# Patient Record
Sex: Male | Born: 1990 | Race: Black or African American | Hispanic: No | Marital: Single | Smoking: Heavy tobacco smoker
Health system: Southern US, Community
[De-identification: ages and names within clinical notes are randomized; demographics above are authoritative.]

## PROBLEM LIST (undated history)

## (undated) DIAGNOSIS — F1010 Alcohol abuse, uncomplicated: Secondary | ICD-10-CM

## (undated) DIAGNOSIS — J45909 Unspecified asthma, uncomplicated: Secondary | ICD-10-CM

---
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
## 2014-07-22 ENCOUNTER — Encounter (HOSPITAL_COMMUNITY): Payer: Self-pay | Admitting: Emergency Medicine

## 2014-07-22 ENCOUNTER — Emergency Department (HOSPITAL_COMMUNITY)
Admission: EM | Admit: 2014-07-22 | Discharge: 2014-07-22 | Disposition: A | Discharge status: Home / Self Care | Payer: Self-pay | Attending: Emergency Medicine | Admitting: Emergency Medicine

## 2014-07-22 DIAGNOSIS — Y9389 Activity, other specified: Secondary | ICD-10-CM | POA: Insufficient documentation

## 2014-07-22 DIAGNOSIS — Y9289 Other specified places as the place of occurrence of the external cause: Secondary | ICD-10-CM | POA: Insufficient documentation

## 2014-07-22 DIAGNOSIS — W57XXXA Bitten or stung by nonvenomous insect and other nonvenomous arthropods, initial encounter: Secondary | ICD-10-CM | POA: Insufficient documentation

## 2014-07-22 DIAGNOSIS — S50851A Superficial foreign body of right forearm, initial encounter: Secondary | ICD-10-CM | POA: Insufficient documentation

## 2014-07-22 DIAGNOSIS — Z720 Tobacco use: Secondary | ICD-10-CM | POA: Insufficient documentation

## 2014-07-22 MED ORDER — CEPHALEXIN 500 MG PO CAPS: 500.0000 mg | ORAL_CAPSULE | Freq: Four times a day (QID) | ORAL | Status: DC

## 2014-07-22 MED ORDER — SULFAMETHOXAZOLE-TRIMETHOPRIM 800-160 MG PO TABS: 2.0000 | ORAL_TABLET | Freq: Two times a day (BID) | ORAL | Status: DC

## 2014-07-22 MED ORDER — HYDROCORTISONE 2.5 % EX LOTN: TOPICAL_LOTION | Freq: Two times a day (BID) | CUTANEOUS | Status: DC

## 2014-07-22 MED ORDER — PREDNISONE 20 MG PO TABS: 60.0000 mg | ORAL_TABLET | Freq: Every day | ORAL | Status: DC
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
## 2014-07-22 NOTE — ED Provider Notes (Signed)
CSN: [id number]     Arrival date & time 07/22/14  2237 History  This chart was scribed for a non-physician practitioner, Santiago GladHeather Aiyanah Kalama, PA-C, working with Linwood DibblesJon Knapp, MD by Julian HyMorgan Graham, ED Scribe. The patient was seen in WTR9/WTR9. The patient's care was started at 10:51 PM.   Chief Complaint  Patient presents with  . Rash   The history is provided by the patient. No language interpreter was used.   HPI Comments: Tommy Horton is a 23 y.o. male who presents to the Emergency Department complaining of new,  gradually worsening rash on his lower right arm and right hand onset 12 hours ago. Pt notes associated swelling, itching, and sensitive to palpation of the arm. Pt notes he does have some small bites on his legs bilaterally and forehead. Pt states he felt like he was being bitten by a mosquito bite last night as he slept, but pt denies seeing an insect. Pt notes he put alcohol and cortisone cream on his rash without relief. Pt denies using Benadryl for his symptoms. Pt denies fever, chills, nausea, vomiting, difficulty breathing, swelling of the tongue or lips at this time.  History reviewed. No pertinent past medical history. No past surgical history on file. No family history on file. History  Substance Use Topics  . Smoking status: Not on file  . Smokeless tobacco: Not on file  . Alcohol Use: Not on file    Review of Systems  Constitutional: Negative for fever and chills.  HENT: Negative for trouble swallowing.   Respiratory: Negative for shortness of breath.   Gastrointestinal: Negative for nausea and vomiting.  Skin: Positive for rash.   Allergies  Review of patient's allergies indicates no known allergies.  Home Medications   Prior to Admission medications   Not on File   Triage Vitals: BP 137/87  Pulse 104  Temp(Src) 98.4 F (36.9 C) (Oral)  Resp 18  SpO2 99% Physical Exam  Nursing note and vitals reviewed. Constitutional: He is oriented to person, place, and  time. He appears well-developed and well-nourished. No distress.  HENT:  Head: Normocephalic and atraumatic.  Mouth/Throat: Oropharynx is clear and moist.  Oropharynx is widely patent. No signs of lips, tongue, or throat.  Eyes: Conjunctivae and EOM are normal.  Neck: Neck supple. No tracheal deviation present.  Cardiovascular: Normal rate and regular rhythm.   Pulses:      Radial pulses are 2+ on the right side, and 2+ on the left side.  2+ right radial pulse.  Pulmonary/Chest: Effort normal and breath sounds normal. No respiratory distress.  Musculoskeletal: Normal range of motion.  FROM of right wrist and right elbow.   Neurological: He is alert and oriented to person, place, and time.  Distal sensation of all fingers is intact.   Skin: Skin is warm and dry. Rash noted. Rash is papular. There is erythema.  Erythematous papules located diffusely at the right forearm, and the proximal aspect of the right hand with surrounding erythema, warmth and mild edema. No drainage or erythematous streaking. Erythematous papule on the right side on his forehead.  Psychiatric: He has a normal mood and affect. His behavior is normal.    ED Course  Procedures (including critical care time) DIAGNOSTIC STUDIES: Oxygen Saturation is 99% on RA, normal by my interpretation.    COORDINATION OF CARE: 11:02 PM- Patient informed of current plan for treatment and evaluation and agrees with plan at this time.   MDM   Final diagnoses:  None   Patient presenting with several erythematous papules of the right arm.  He does have erythema, warmth, tenderness, and edema surrounding the papules.  Concern for possible cellulitis, but could be a localized inflammatory reaction.  No erythematous streaking.  Patient is afebrile.  Non toxic appearing.  No swelling of the lips, tongue, or throat.  Lungs CTAB.  Feel that the patient is stable for discharge.  Patient discharged home with antibiotics to treat for possible  Cellulitis.  Patient also given Rx for Prednisone and informed to take Benadryl.  Patient stable for discharge.  Return precautions given.   Santiago Glad, PA-C 07/22/14 2322

## 2014-07-23 NOTE — ED Provider Notes (Signed)
Medical screening examination/treatment/procedure(s) were performed by non-physician practitioner and as supervising physician I was immediately available for consultation/collaboration.    Donicia Druck, MD 07/23/14 1831 

## 2014-08-06 ENCOUNTER — Emergency Department (HOSPITAL_COMMUNITY): Payer: No Typology Code available for payment source

## 2014-08-06 ENCOUNTER — Emergency Department (HOSPITAL_COMMUNITY)
Admission: EM | Admit: 2014-08-06 | Discharge: 2014-08-06 | Disposition: A | Discharge status: Home / Self Care | Payer: Self-pay | Attending: Emergency Medicine | Admitting: Emergency Medicine

## 2014-08-06 ENCOUNTER — Encounter (HOSPITAL_COMMUNITY): Payer: Self-pay | Admitting: Emergency Medicine

## 2014-08-06 DIAGNOSIS — S8002XA Contusion of left knee, initial encounter: Secondary | ICD-10-CM

## 2014-08-06 DIAGNOSIS — Y9389 Activity, other specified: Secondary | ICD-10-CM | POA: Insufficient documentation

## 2014-08-06 DIAGNOSIS — S199XXA Unspecified injury of neck, initial encounter: Secondary | ICD-10-CM | POA: Insufficient documentation

## 2014-08-06 DIAGNOSIS — J45909 Unspecified asthma, uncomplicated: Secondary | ICD-10-CM | POA: Insufficient documentation

## 2014-08-06 DIAGNOSIS — Z720 Tobacco use: Secondary | ICD-10-CM | POA: Insufficient documentation

## 2014-08-06 DIAGNOSIS — M542 Cervicalgia: Secondary | ICD-10-CM

## 2014-08-06 DIAGNOSIS — S60221A Contusion of right hand, initial encounter: Secondary | ICD-10-CM

## 2014-08-06 DIAGNOSIS — Y92410 Unspecified street and highway as the place of occurrence of the external cause: Secondary | ICD-10-CM | POA: Insufficient documentation

## 2014-08-06 HISTORY — DX: Unspecified asthma, uncomplicated: J45.909

## 2014-08-06 MED ORDER — HYDROCODONE-ACETAMINOPHEN 5-325 MG PO TABS: 1.0000 | ORAL_TABLET | Freq: Four times a day (QID) | ORAL | Status: AC | PRN

## 2014-08-06 MED ORDER — IBUPROFEN 800 MG PO TABS: 800.0000 mg | ORAL_TABLET | Freq: Once | ORAL | Status: DC

## 2014-08-06 MED ORDER — OXYCODONE-ACETAMINOPHEN 5-325 MG PO TABS
1.0000 | ORAL_TABLET | Freq: Once | ORAL | Status: AC
  Administered 2014-08-06: 1 via ORAL
  Filled 2014-08-06: qty 1
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
## 2014-08-06 NOTE — Discharge Instructions (Signed)
Contusion °A contusion is a deep bruise. Contusions are the result of an injury that caused bleeding under the skin. The contusion may turn blue, purple, or yellow. Minor injuries will give you a painless contusion, but more severe contusions may stay painful and swollen for a few weeks.  °CAUSES  °A contusion is usually caused by a blow, trauma, or direct force to an area of the body. °SYMPTOMS  °· Swelling and redness of the injured area. °· Bruising of the injured area. °· Tenderness and soreness of the injured area. °· Pain. °DIAGNOSIS  °The diagnosis can be made by taking a history and physical exam. An X-ray, CT scan, or MRI may be needed to determine if there were any associated injuries, such as fractures. °TREATMENT  °Specific treatment will depend on what area of the body was injured. In general, the best treatment for a contusion is resting, icing, elevating, and applying cold compresses to the injured area. Over-the-counter medicines may also be recommended for pain control. Ask your caregiver what the best treatment is for your contusion. °HOME CARE INSTRUCTIONS  °· Put ice on the injured area. °¨ Put ice in a plastic bag. °¨ Place a towel between your skin and the bag. °¨ Leave the ice on for 15-20 minutes, 3-4 times a day, or as directed by your health care provider. °· Only take over-the-counter or prescription medicines for pain, discomfort, or fever as directed by your caregiver. Your caregiver may recommend avoiding anti-inflammatory medicines (aspirin, ibuprofen, and naproxen) for 48 hours because these medicines may increase bruising. °· Rest the injured area. °· If possible, elevate the injured area to reduce swelling. °SEEK IMMEDIATE MEDICAL CARE IF:  °· You have increased bruising or swelling. °· You have pain that is getting worse. °· Your swelling or pain is not relieved with medicines. °MAKE SURE YOU:  °· Understand these instructions. °· Will watch your condition. °· Will get help right  away if you are not doing well or get worse. °Document Released: 07/17/2005 Document Revised: 10/12/2013 Document Reviewed: 08/12/2011 °ExitCare® Patient Information ©2015 [employer]. This information is not intended to replace advice given to you by your health care provider. Make sure you discuss any questions you have with your health care provider. ° °
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
## 2014-08-09 ENCOUNTER — Emergency Department (HOSPITAL_COMMUNITY): Payer: No Typology Code available for payment source

## 2014-08-09 ENCOUNTER — Emergency Department (HOSPITAL_COMMUNITY)
Admission: EM | Admit: 2014-08-09 | Discharge: 2014-08-09 | Disposition: A | Discharge status: Home / Self Care | Payer: Self-pay | Attending: Emergency Medicine | Admitting: Emergency Medicine

## 2014-08-09 ENCOUNTER — Encounter (HOSPITAL_COMMUNITY): Payer: Self-pay | Admitting: Emergency Medicine

## 2014-08-09 DIAGNOSIS — Z720 Tobacco use: Secondary | ICD-10-CM | POA: Insufficient documentation

## 2014-08-09 DIAGNOSIS — S60221D Contusion of right hand, subsequent encounter: Secondary | ICD-10-CM | POA: Insufficient documentation

## 2014-08-09 DIAGNOSIS — J45909 Unspecified asthma, uncomplicated: Secondary | ICD-10-CM | POA: Insufficient documentation

## 2014-08-09 MED ORDER — NAPROXEN 500 MG PO TABS
500.0000 mg | ORAL_TABLET | Freq: Two times a day (BID) | ORAL | Status: AC
Start: 2014-08-09 — End: ?

## 2014-08-09 MED ORDER — TRAMADOL HCL 50 MG PO TABS: 50.0000 mg | ORAL_TABLET | Freq: Four times a day (QID) | ORAL | Status: AC | PRN

## 2014-08-09 NOTE — ED Provider Notes (Signed)
Medical screening examination/treatment/procedure(s) were performed by non-physician practitioner and as supervising physician I was immediately available for consultation/collaboration.   EKG Interpretation None        Shon Batonourtney F Naylin Burkle, MD 08/09/14 1818

## 2014-08-09 NOTE — Progress Notes (Signed)
[facility] Stacy,  ° °Provided pt with a list of primary care resources and a [facility] Card application to help patient establish primary care.  °
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
## 2014-08-09 NOTE — Discharge Instructions (Signed)
Continue to keep hand elevated. Ice. Try to get the swelling to go down. Naprosyn for pain and inflammation. Ultram for severe pain. Follow up with a hand specialist if not improving. ACE wrap for support and compression.   Hand Contusion A hand contusion is a deep bruise on your hand area. Contusions are the result of an injury that caused bleeding under the skin. The contusion may turn blue, purple, or yellow. Minor injuries will give you a painless contusion, but more severe contusions may stay painful and swollen for a few weeks. CAUSES  A contusion is usually caused by a blow, trauma, or direct force to an area of the body. SYMPTOMS   Swelling and redness of the injured area.  Discoloration of the injured area.  Tenderness and soreness of the injured area.  Pain. DIAGNOSIS  The diagnosis can be made by taking a history and performing a physical exam. An X-ray, CT scan, or MRI may be needed to determine if there were any associated injuries, such as broken bones (fractures). TREATMENT  Often, the best treatment for a hand contusion is resting, elevating, icing, and applying cold compresses to the injured area. Over-the-counter medicines may also be recommended for pain control. HOME CARE INSTRUCTIONS   Put ice on the injured area.  Put ice in a plastic bag.  Place a towel between your skin and the bag.  Leave the ice on for 15-20 minutes, 03-04 times a day.  Only take over-the-counter or prescription medicines as directed by your caregiver. Your caregiver may recommend avoiding anti-inflammatory medicines (aspirin, ibuprofen, and naproxen) for 48 hours because these medicines may increase bruising.  If told, use an elastic wrap as directed. This can help reduce swelling. You may remove the wrap for sleeping, showering, and bathing. If your fingers become numb, cold, or blue, take the wrap off and reapply it more loosely.  Elevate your hand with pillows to reduce  swelling.  Avoid overusing your hand if it is painful. SEEK IMMEDIATE MEDICAL CARE IF:   You have increased redness, swelling, or pain in your hand.  Your swelling or pain is not relieved with medicines.  You have loss of feeling in your hand or are unable to move your fingers.  Your hand turns cold or blue.  You have pain when you move your fingers.  Your hand becomes warm to the touch.  Your contusion does not improve in 2 days. MAKE SURE YOU:   Understand these instructions.  Will watch your condition.  Will get help right away if you are not doing well or get worse. Document Released: 03/29/2002 Document Revised: 07/01/2012 Document Reviewed: 03/30/2012 [facility] Patient Information 2015 [employer], [place]. This information is not intended to replace advice given to you by your health care provider. Make sure you discuss any questions you have with your health care provider.
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
## 2015-04-24 ENCOUNTER — Encounter (HOSPITAL_COMMUNITY): Payer: Self-pay

## 2015-04-24 ENCOUNTER — Emergency Department (HOSPITAL_COMMUNITY)
Admission: EM | Admit: 2015-04-24 | Discharge: 2015-04-24 | Disposition: A | Discharge status: Home / Self Care | Payer: Self-pay | Attending: Emergency Medicine | Admitting: Emergency Medicine

## 2015-04-24 DIAGNOSIS — Z720 Tobacco use: Secondary | ICD-10-CM | POA: Insufficient documentation

## 2015-04-24 DIAGNOSIS — J45909 Unspecified asthma, uncomplicated: Secondary | ICD-10-CM | POA: Insufficient documentation

## 2015-04-24 DIAGNOSIS — F1010 Alcohol abuse, uncomplicated: Secondary | ICD-10-CM | POA: Insufficient documentation

## 2015-04-24 HISTORY — DX: Alcohol abuse, uncomplicated: F10.10

## 2015-04-24 MED ORDER — CHLORDIAZEPOXIDE HCL 25 MG PO CAPS: ORAL_CAPSULE | ORAL | Status: AC

## 2015-04-24 NOTE — ED Notes (Signed)
Patient states he normally drinks 2-3 24 ounce beers daily x 3 years. Marijuana daily 3-4 times a day. Patient denies any SI/HI, visual or auditory hallucinations.

## 2015-04-24 NOTE — Discharge Instructions (Signed)
Alcohol Withdrawal °Anytime drug use is interfering with normal living activities it has become abuse. This includes problems with family and friends. Psychological dependence has developed when your mind tells you that the drug is needed. This is usually followed by physical dependence when a continuing increase of drugs are required to get the same feeling or "high." This is known as addiction or chemical dependency. A person's risk is much higher if there is a history of chemical dependency in the family. °Mild Withdrawal Following Stopping Alcohol, When Addiction or Chemical Dependency Has Developed °When a person has developed tolerance to alcohol, any sudden stopping of alcohol can cause uncomfortable physical symptoms. Most of the time these are mild and consist of tremors in the hands and increases in heart rate, breathing, and temperature. Sometimes these symptoms are associated with anxiety, panic attacks, and bad dreams. There may also be stomach upset. Normal sleep patterns are often interrupted with periods of inability to sleep (insomnia). This may last for 6 months. Because of this discomfort, many people choose to continue drinking to get rid of this discomfort and to try to feel normal. °Severe Withdrawal with Decreased or No Alcohol Intake, When Addiction or Chemical Dependency Has Developed °About five percent of alcoholics will develop signs of severe withdrawal when they stop using alcohol. One sign of this is development of generalized seizures (convulsions). Other signs of this are severe agitation and confusion. This may be associated with believing in things which are not real or seeing things which are not really there (delusions and hallucinations). Vitamin deficiencies are usually present if alcohol intake has been long-term. Treatment for this most often requires hospitalization and close observation. °Addiction can only be helped by stopping use of all chemicals. This is hard but may  save your life. With continual alcohol use, possible outcomes are usually loss of self respect and esteem, violence, and death. °Addiction cannot be cured but it can be stopped. This often requires outside help and the care of professionals. Treatment centers are listed in the yellow pages under Cocaine, Narcotics, and Alcoholics Anonymous. Most hospitals and clinics can refer you to a specialized care center. °It is not necessary for you to go through the uncomfortable symptoms of withdrawal. Your caregiver can provide you with medicines that will help you through this difficult period. Try to avoid situations, friends, or drugs that made it possible for you to keep using alcohol in the past. Learn how to say no. °It takes a long period of time to overcome addictions to all drugs, including alcohol. There may be many times when you feel as though you want a drink. After getting rid of the physical addiction and withdrawal, you will have a lessening of the craving which tells you that you need alcohol to feel normal. Call your caregiver if more support is needed. Learn who to talk to in your family and among your friends so that during these periods you can receive outside help. Alcoholics Anonymous (AA) has helped many people over the years. To get further help, contact AA or call your caregiver, counselor, or clergyperson. Al-Anon and Alateen are support groups for friends and family members of an alcoholic. The people who love and care for an alcoholic often need help, too. For information about these organizations, check your phone directory or call a local alcoholism treatment center.  °SEEK IMMEDIATE MEDICAL CARE IF:  °· You have a seizure. °· You have a fever. °· You experience uncontrolled vomiting or you   vomit up blood. This may be bright red or look like black coffee grounds. °· You have blood in the stool. This may be bright red or appear as a black, tarry, bad-smelling stool. °· You become lightheaded or  faint. Do not drive if you feel this way. Have someone else drive you or call 911 for help. °· You become more agitated or confused. °· You develop uncontrolled anxiety. °· You begin to see things that are not really there (hallucinate). °Your caregiver has determined that you completely understand your medical condition, and that your mental state is back to normal. You understand that you have been treated for alcohol withdrawal, have agreed not to drink any alcohol for a minimum of 1 day, will not operate a car or other machinery for 24 hours, and have had an opportunity to ask any questions about your condition. °Document Released: 07/17/2005 Document Revised: 12/30/2011 Document Reviewed: 05/25/2008 °ExitCare® Patient Information ©2015 [employer]. This information is not intended to replace advice given to you by your health care provider. Make sure you discuss any questions you have with your health care provider. ° ° ° °Emergency Department Resource Guide °1) Find a Doctor and Pay Out of Pocket °Although you won't have to find out who is covered by your insurance plan, it is a good idea to ask around and get recommendations. You will then need to call the office and see if the doctor you have chosen will accept you as a new patient and what types of options they offer for patients who are self-pay. Some doctors offer discounts or will set up payment plans for their patients who do not have insurance, but you will need to ask so you aren't surprised when you get to your appointment. ° °2) Contact Your Local Health Department °Not all health departments have doctors that can see patients for sick visits, but many do, so it is worth a call to see if yours does. If you don't know where your local health department is, you can check in your phone book. The CDC also has a tool to help you locate your state's health department, and many state websites also have listings of all of their local health  departments. ° °3) Find a Walk-in Clinic °If your illness is not likely to be very severe or complicated, you may want to try a walk in clinic. These are popping up all over the country in pharmacies, drugstores, and shopping centers. They're usually staffed by nurse practitioners or physician assistants that have been trained to treat common illnesses and complaints. They're usually fairly quick and inexpensive. However, if you have serious medical issues or chronic medical problems, these are probably not your best option. ° °No Primary Care Doctor: °- Call [facility] at  832-8000 - they can help you locate a primary care doctor that  accepts your insurance, provides certain services, etc. °- Physician Referral Service- 1-800-533-3463 ° °Chronic Pain Problems: °Organization         Address  Phone   Notes  °[facility]  (336) 297-2271 Patients need to be referred by their primary care doctor.  ° °Medication Assistance: °Organization         Address  Phone   Notes  °[place] [facility] [address] °[place], [place] [address] (336) 641-8030 --Must be a resident of [place] °-- Must have NO insurance coverage whatsoever (no Medicaid/ Medicare, etc.) °-- The pt. MUST have   a primary care doctor that directs their care regularly and follows them in the community °  °[facility]  (866) 331-1348   °[employer]  (888) 892-1162   ° °Agencies that provide inexpensive medical care: °Organization         Address  Phone   Notes  °[facility]  (336) 832-8035   °[facility]    (336) 832-7272   °[facility] [address] °[place], [place] [address] (336) 832-4777   °[facility] [address], °[place] (336) 271-4999   °[facility]    (336) 373-0678   °[facility]    (336) 272-1050   °[facility] ° [address], [place] Phone:  (336)  832-4444, Fax:  (336) 832-4440 Hours of Operation:  9 am - 6 pm, M-F.  Also accepts Medicaid/Medicare and self-pay.  °[facility] ° [address], [place] Phone: (336) 832-3150, Fax: (336) 832-3151. Hours of Operation:  8:30 am - 5:30 pm, M-F.  Also accepts Medicaid and self-pay.  °[facility] [place] [address], [place] Phone: (336) 878-6027   °[facility] [address], [place], [place] (336)723-1848, Ext. 123 Mondays & Thursdays: 7-9 AM.  First 15 patients are seen on a first come, first serve basis. °  ° °Medicaid-accepting [place] Providers: ° °Organization         Address  Phone   Notes  °[facility] [address], [place] (336) 641-2100 Also accepts self-pay patients.  °[facility] [address], [place] ° (336) 856-9996   °[facility] [address], [place] (336) 288-8857   °[facility] [address] [place] [address], [place] (336) 299-7000   °Veita Bland [address], [place]  ° (336) 373-1557 Only accepts [place] Access Medicaid patients after they have their name applied to their card.  ° °Self-Pay (no insurance) in [place]: ° °Organization         Address  Phone   Notes  °Sickle Cell Patients, [facility] [address], [place] (336) 832-1970   °[facility] [address], [place] (336) 832-4400   °[facility] ° [address], [place] (336) 992-4800   °[facility]/Dr. Osei-Bonsu ° [address] [place] [address], [place] or [address], [place] (336) 841-8500 Phone number for both [place] and [place] locations is the same.  °[facility] [address], [place] (336) 299-0000   °[facility] [address] [place] [address], [place] or [address] (336)  852-7530 °(336) 878-2260   °[facility] [address], [place] (336) 350-1642, phone; (336) 294-5005, fax Sees patients 1st and 3rd Saturday of every month.  Must not qualify for public or private insurance (i.e. Medicaid, Medicare, [place] Health Choice, Veterans' Benefits) • Household income should be no more than 200% of the poverty level •The clinic cannot treat you if you are pregnant or think you are pregnant • Sexually transmitted diseases are not treated at the clinic.  ° ° °Dental Care: °Organization         Address  Phone  Notes  °[facility] [address],  (  336) 641-6152 Accepts children up to age 21 who are enrolled in Medicaid or [place] Health Choice; pregnant women with a Medicaid card; and children who have applied for Medicaid or [place] Health Choice, but were declined, whose parents can pay a reduced fee at time of service.  °[facility] [place]  [address], [place] (336) 641-7733 Accepts children up to age 21 who are enrolled in Medicaid or [place] Health Choice; pregnant women with a Medicaid card; and children who have applied for Medicaid or [place] Health Choice, but were declined, whose parents can pay a reduced fee at time of service.  °[facility] ° [address], [place] (336) 641-4533 Patients are seen by appointment only. Walk-ins are not accepted. [facility] will see patients 18 years of age and older. °Monday - Tuesday (8am-5pm) °Most Wednesdays (8:30-5pm) °$30 per visit, cash only  °[facility] ° [address], [place] (336) 641-4533 Patients are seen by appointment only. Walk-ins are not accepted. [facility] will see patients 18 years of age and older. °One Wednesday Evening (Monthly: Volunteer Based).  $30 per visit, cash only  °[facility]  (919) 537-3737 for adults;  Children under age 4, call Graduate Pediatric Dentistry at ([phone]) [phone]. Children aged 4-14, please call (919) [phone] to request a pediatric application. ° Dental services are provided in all areas of dental care including fillings, crowns and bridges, complete and partial dentures, implants, gum treatment, root canals, and extractions. Preventive care is also provided. Treatment is provided to both adults and children. °Patients are selected via a lottery and there is often a waiting list. °  °[facility] [address], °[place] ° (336) 763-8833 [email or web address] °  °[facility] [address], [place] (336)723-1848, Ext. 123 Second and Fourth Thursday of each month, opens at 6:30 AM; Clinic ends at 9 AM.  Patients are seen on a first-come first-served basis, and a limited number are seen during each clinic.  ° °[facility] ° [address], [place], [place] (336) 723-7904   Eligibility Requirements °You must have lived in [place], [place], or [place] counties for at least the last three months. °  You cannot be eligible for state or federal sponsored healthcare insurance, including Veterans Administration, Medicaid, or Medicare. °  You generally cannot be eligible for healthcare insurance through your employer.  °  How to apply: °Eligibility screenings are held every Tuesday and Wednesday afternoon from 1:00 pm until 4:00 pm. You do not need an appointment for the interview!  °[facility] [address], [place], [place] 336-631-2330   °[facility]  336-342-8273   °[facility]  336-703-3100   °[facility]  336-570-6415   ° °Behavioral Health Resources in the Community: °Intensive Outpatient Programs °Organization         Address  Phone  Notes  °[facility] [address], [place], [place] 336-878-6098   °[facility] [address], [place], [place] 336-832-9800   °[facility] [address], [place], [place] ° 336-882-2125   °[facility] [address],  °[place],  1-800-853-5163 or 336-641-4981   °Substance Abuse Resources °Organization         Address  Phone  Notes  °[facility]    336-882-2125   °[facility]  336-784-9470   °[facility]  336-285-9073   °[facility]  336-845-3988   °Residential & Outpatient Substance Abuse Program  1-800-659-3381   °Psychological Services °Organization         Address  Phone  Notes  °[facility]  336- 832-9600   °[facility]  336- 378-7881   °[facility] [address], [place] 1-800-853-5163 or 336-641-4981   ° °Mobile Crisis Teams °Organization         Address  Phone  Notes  °[facility], Mobile Crisis Care Unit  1-877-626-1772   °Assertive °[facility] ° [address] [place], [place] 336-834-9664   °Sharon DeEsch [address] °[place] 336-554-5454   ° °Self-Help/Support Groups °Organization         Address  Phone             Notes  °[facility] - variety of support groups  336- 373-1402 Call for more information  °Narcotics Anonymous (NA), [facility] [address], °[place]  2 meetings at this location  ° °Residential Treatment Programs °Organization         Address  Phone  Notes  °[facility] [address],    °[place]  1-866-801-8205   °[facility] ° [address], [place], [place] 704-293-8524   °[facility] [address], [place] 336-845-3988 Admissions: 8am-3pm M-F  °[facility] [address]    °[place], [place] 336-841-1104   °[facility] [address], [place], [place] 336-379-7146   °[facility] [address]  °[place], [place] 336-285-9073   °[facility] - Intensive  Outpatient [address], [place], [place] 336-852-3033   °[facility] [address]  °[place], [place] 1-877-615-2722 or 336-784-9470   °[facility] [address] [place], [place] 336-227-7417 Accepts Medicaid  °[facility] [address]  °[place] 1-800-659-3381 Substance Abuse/Addiction Treatment  ° °[place] Behavioral Health Resources °Organization         Address  Phone  Notes  °[facility]  (888) 581-9988   °Julie Brannon, PhD [address] [place], [place]   (336) 349-5553 or (336) 951-0000   °[facility]   [address] °[place], [place] (336) 349-4454   °[facility] [address], [place], [place] (336) 342-8316 Insurance/Medicaid/sponsorship through [facility]  °[facility] [address]                                    [place], [place] (336) 342-8316 Therapy/tele-psych/case  °[facility] [address]  ° [place], [place] (336) 349-2233    °Dr. Arfeen  (336) 349-4544   °[facility] [place]  [employer] [place] [facility] 1) [address], [place] °2) [address], [place] °3)  [address], [place] (336) 349-3220 °(336) 342-7768 ° °(336) 342-8140   °[place] [facility] (336) 342-1394 or (336) 342-3537 (After Hours)    ° ° ° °

## 2015-04-24 NOTE — ED Provider Notes (Signed)
CSN: [id number]     Arrival date & time 04/24/15  1712 History   First MD Initiated Contact with Patient 04/24/15 1750     Chief Complaint  Patient presents with  . detox alcohol      (Consider location/radiation/quality/duration/timing/severity/associated sxs/prior Treatment) Patient is a 24 y.o. male presenting with intoxication. The history is provided by the patient. No language interpreter was used.  Alcohol Intoxication This is a new problem. The current episode started more than 1 year ago. The problem occurs constantly. The problem has been gradually worsening. Pertinent negatives include no abdominal pain. Nothing aggravates the symptoms. He has tried nothing for the symptoms. The treatment provided no relief.  Pt reports he drinks daily.  He spokes marijuana and recently has been using [place].   Pt is interested in inpatient treatment.  No suicidal or homicidal thoughts.  Pt reports no alcohol or drugs today.  He feels a little shaky   Past Medical History  Diagnosis Date  . Asthma   . ETOH abuse    History reviewed. No pertinent past surgical history. Family History  Problem Relation Age of Onset  . Diabetes Mother   . Hypertension Mother   . Hypertension Father   . Diabetes Father    History  Substance Use Topics  . Smoking status: Heavy Tobacco Smoker -- 0.50 packs/day    Types: Cigarettes  . Smokeless tobacco: Never Used  . Alcohol Use: Yes     Comment: daily use 2-3  24ounce beers daily    Review of Systems  Gastrointestinal: Negative for abdominal pain.  All other systems reviewed and are negative.     Allergies  Review of patient's allergies indicates no known allergies.  Home Medications   Prior to Admission medications   Medication Sig Start Date End Date Taking? Authorizing Provider  chlordiazePOXIDE (LIBRIUM) 25 MG capsule  PO TID x 1D, then 25-50mg  PO BID X 1D, then 25-50mg  PO QD X 1D 04/24/15   Elson Areas, PA-C  HYDROcodone-acetaminophen  (NORCO/VICODIN) 5-325 MG per tablet Take 1 tablet by mouth every 6 (six) hours as needed for moderate pain. Patient not taking: Reported on 04/24/2015 08/06/14   Azalia Bilis, MD  naproxen (NAPROSYN) 500 MG tablet Take 1 tablet (500 mg total) by mouth 2 (two) times daily. Patient not taking: Reported on 04/24/2015 08/09/14   Tatyana Kirichenko, PA-C  traMADol (ULTRAM) 50 MG tablet Take 1 tablet (50 mg total) by mouth every 6 (six) hours as needed. Patient not taking: Reported on 04/24/2015 08/09/14   Tatyana Kirichenko, PA-C   BP 123/81 mmHg  Pulse 52  Temp(Src) 98.1 F (36.7 C) (Oral)  Resp 16  SpO2 100% Physical Exam  Constitutional: He is oriented to person, place, and time. He appears well-developed and well-nourished.  HENT:  Head: Normocephalic and atraumatic.  Right Ear: External ear normal.  Left Ear: External ear normal.  Mouth/Throat: Oropharynx is clear and moist.  Eyes: EOM are normal.  Neck: Normal range of motion.  Cardiovascular: Normal rate.   Pulmonary/Chest: Effort normal.  Abdominal: Soft. He exhibits no distension.  Musculoskeletal: Normal range of motion.  Neurological: He is alert and oriented to person, place, and time.  Skin: Skin is warm.  Psychiatric: He has a normal mood and affect.  Nursing note and vitals reviewed.   ED Course  Procedures (including critical care time) Labs Review Labs Reviewed - No data to display  Imaging Review No results found.   EKG Interpretation None  MDM CIWA used, Pt given 3 day librium protocol.   Pt is given referrals to substance abuse inpatient and day treatment programs.    Final diagnoses:  Alcohol abuse    Resource guide.    Lonia SkinnerLeslie K South Mountain[place], PA-C 04/24/15 1904  Derwood KaplanAnkit Nanavati, MD 04/25/15 [phone]

## 2015-10-01 ENCOUNTER — Emergency Department (HOSPITAL_COMMUNITY)
Admission: EM | Admit: 2015-10-01 | Discharge: 2015-10-01 | Discharge status: Against Medical Advice | Payer: No Typology Code available for payment source

## 2015-10-01 NOTE — ED Notes (Signed)
Patient called x 2, not in lobby.
